# Patient Record
Sex: Male | Born: 2005 | Race: White | Hispanic: No | Marital: Single | Smoking: Never smoker
Health system: Southern US, Community
[De-identification: ages and names within clinical notes are randomized; demographics above are authoritative.]

---
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
## 2023-11-26 ENCOUNTER — Ambulatory Visit: Payer: Self-pay | Admitting: Nurse Practitioner

## 2023-11-26 ENCOUNTER — Encounter: Payer: Self-pay | Admitting: Nurse Practitioner

## 2023-11-26 VITALS — BP 118/74 | HR 97 | Temp 98.6°F

## 2023-11-26 DIAGNOSIS — G8929 Other chronic pain: Secondary | ICD-10-CM

## 2023-11-26 NOTE — Progress Notes (Signed)
  Subjective:     Patient ID: Peter Perry, male   DOB: Dec 24, 2005, 18 y.o.   MRN: [id number]  HPI: 18 yo male presents today with L knee pain. Onset with injury 1 year ago while playing basketball. He states that he fell on it weird. This occurred around 06/2022. On long drives, he has to pop his knee to make the pain go away. Popping his knee by pushing the knee and bending it makes the pain improve. Denies locking of knee. He has not tried any medications or other treatments. He is aware of the pain/discomfort with deep bending of the L knee. He has not seen anyone for this problem before. Describes pain and mild and intermittent. Denies increased pain today but his mother wanted him to have it checked out.    Review of Systems  Constitutional:  Negative for activity change.  Cardiovascular:  Negative for leg swelling.  Musculoskeletal:  Positive for arthralgias. Negative for gait problem, joint swelling and myalgias.       Objective:   Physical Exam Constitutional:      General: He is not in acute distress.    Appearance: Normal appearance.  HENT:     Head: Normocephalic and atraumatic.  Cardiovascular:     Rate and Rhythm: Normal rate and regular rhythm.     Heart sounds: No murmur heard.    No friction rub. No gallop.  Pulmonary:     Effort: Pulmonary effort is normal.     Breath sounds: Normal breath sounds.  Musculoskeletal:        General: No swelling or tenderness. Normal range of motion.     Right lower leg: No edema.     Left lower leg: No edema.     Comments: L knee and LLE without erythema, edema. 1+ BL pedal pulses. No TTP along knee joint. L knee stable AP and laterally. No pain with PROM. No pops, clicks palpated.   Skin:    General: Skin is warm and dry.     Findings: No bruising or erythema.  Neurological:     Mental Status: He is alert.        Assessment:     Chronic pain of left knee     Plan:     1. Chronic pain of left knee  Chronic L knee  pain post injury. Patient report of injury and no medical treatment up to this point. Discussed limiting activities that exacerbate pain and NSAID PRN with food. He does not feel it is a problem at this time. Discussed recommendations for Ortho with imaging as this is a longstanding issue. Suspect meniscal injury. He will consider. Return precautions reviewed. Verbalized agreement and understanding.

## 2023-12-01 ENCOUNTER — Ambulatory Visit
Admission: RE | Admit: 2023-12-01 | Discharge: 2023-12-01 | Disposition: A | Discharge status: Home / Self Care | Payer: Self-pay | Source: Ambulatory Visit | Attending: Emergency Medicine | Admitting: Emergency Medicine

## 2023-12-01 VITALS — BP 130/86 | HR 83 | Temp 98.1°F | Resp 16 | Wt 175.0 lb

## 2023-12-01 DIAGNOSIS — S90812A Abrasion, left foot, initial encounter: Secondary | ICD-10-CM | POA: Diagnosis not present

## 2023-12-01 MED ORDER — TETANUS-DIPHTH-ACELL PERTUSSIS 5-2.5-18.5 LF-MCG/0.5 IM SUSY
0.5000 mL | PREFILLED_SYRINGE | Freq: Once | INTRAMUSCULAR | Status: AC
  Administered 2023-12-01: 0.5 mL via INTRAMUSCULAR

## 2023-12-01 MED ORDER — CEPHALEXIN 500 MG PO CAPS: 500.0000 mg | ORAL_CAPSULE | Freq: Two times a day (BID) | ORAL | 0 refills | Status: AC

## 2023-12-01 NOTE — ED Provider Notes (Signed)
 [facility]    CSN: [id number] Arrival date & time: 12/01/23  0859      History   Chief Complaint Chief Complaint  Patient presents with   Foot Injury    I cut my foot over the weekend and need to have it looked at for work - Entered by patient    HPI Peter Perry is a 18 y.o. male.   Patient presents for evaluation of a laceration to the left foot beginning 3 days ago after stepping into a ditch, unknown offending agent.  Has full range of motion and is able to bear weight.  Only experience pain when pressure is applied directly to the wound.  Denies swelling, drainage, fever.  Last tetanus shot unknown but up-to-date on childhood vaccines.     History reviewed. No pertinent past medical history.  There are no active problems to display for this patient.   History reviewed. No pertinent surgical history.     Home Medications    Prior to Admission medications   Not on File    Family History Family History  Problem Relation Age of Onset   Healthy Mother    Healthy Father     Social History Social History   Tobacco Use   Smoking status: Never    Passive exposure: Never   Smokeless tobacco: Never  Vaping Use   Vaping status: Never Used  Substance Use Topics   Alcohol use: Never   Drug use: Never     Allergies   Patient has no known allergies.   Review of Systems Review of Systems   Physical Exam Triage Vital Signs ED Triage Vitals  Encounter Vitals Group     BP 12/01/23 0911 130/86     Girls Systolic BP Percentile --      Girls Diastolic BP Percentile --      Boys Systolic BP Percentile --      Boys Diastolic BP Percentile --      Pulse Rate 12/01/23 0911 83     Resp 12/01/23 0911 16     Temp 12/01/23 0911 98.1 F (36.7 C)     Temp Source 12/01/23 0911 Oral     SpO2 12/01/23 0911 98 %     Weight 12/01/23 0911 175 lb 0.7 oz (79.4 kg)     Height --      Head Circumference --      Peak Flow --      Pain Score 12/01/23  0910 8     Pain Loc --      Pain Education --      Exclude from Growth Chart --    No data found.  Updated Vital Signs BP 130/86 (BP Location: Left Arm)   Pulse 83   Temp 98.1 F (36.7 C) (Oral)   Resp 16   Wt 175 lb 0.7 oz (79.4 kg)   SpO2 98%   Visual Acuity Right Eye Distance:   Left Eye Distance:   Bilateral Distance:    Right Eye Near:   Left Eye Near:    Bilateral Near:     Physical Exam Constitutional:      Appearance: Normal appearance.  Eyes:     Extraocular Movements: Extraocular movements intact.  Pulmonary:     Effort: Pulmonary effort is normal.  Skin:    Comments: 2 x 3 abrasion present to the plantar aspect of the lateral midfoot, nondraining, tender to palpation, mild swelling, erythematous granulation tissue noted  Neurological:  Mental Status: He is alert and oriented to person, place, and time. Mental status is at baseline.      UC Treatments / Results  Labs (all labs ordered are listed, but only abnormal results are displayed) Labs Reviewed - No data to display  EKG   Radiology No results found.  Procedures Procedures (including critical care time)  Medications Ordered in UC Medications - No data to display  Initial Impression / Assessment and Plan / UC Course  I have reviewed the triage vital signs and the nursing notes.  Pertinent labs & imaging results that were available during my care of the patient were reviewed by me and considered in my medical decision making (see chart for details).  Abrasion of left foot, initial encounter  Empirically placed on antibiotics is still experiencing significant tenderness, cephalexin  prescribed, recommended daily cleansing, may leave open to air, recommend over-the-counter analgesics for pain, unknown cause therefore empirically given tetanus booster, advise follow-up for any delays in healing Final Clinical Impressions(s) / UC Diagnoses   Final diagnoses:  None   Discharge  Instructions   None    ED Prescriptions   None    PDMP not reviewed this encounter.   Teresa Shelba SAUN[place], [place] 12/01/23 [phone]

## 2023-12-01 NOTE — Discharge Instructions (Signed)
 Today you were evaluated for the wound to your foot which appears to be as great as the first layer of skin has been removed  As you are still experiencing tenderness we will empirically place you on antibiotics to ensure areas not become infected and that all germs have been removed  Take cephalexin  twice daily for 5 days  Cleanse over the wound with soap and water during normal hygiene, pat and do not rub, may apply a nonstick Band-Aid or leave open to air to preference  Tetanus shot has been updated as we do not know what has caused scrape, good for 10 years (2035)  May take Tylenol  and/or Motrin as needed for pain  May follow-up with urgent care as needed for any delays in healing

## 2023-12-01 NOTE — ED Triage Notes (Signed)
 Pt presents c/o foot injury of left foot. Pt says he fell in a ditch on Saturday and cut it.

## 2023-12-02 ENCOUNTER — Telehealth: Payer: Self-pay | Admitting: Registered Nurse

## 2023-12-02 ENCOUNTER — Encounter: Payer: Self-pay | Admitting: Registered Nurse

## 2023-12-02 DIAGNOSIS — S90812A Abrasion, left foot, initial encounter: Secondary | ICD-10-CM

## 2023-12-02 NOTE — Telephone Encounter (Signed)
 Patient seen by RN Asberry Budge [employer] 11/30/23 at 0835 injured foot at home over the weekend.  RN applied antiseptic first aid spray to affected area after removing bandaid then applied antibiotic ointment and new bandaid instructed patient to talk with supervisor regarding prolonged walking and standing to be decreased today until area can heal.  Spoke with mother patient no further needs 12/01/23 still on modified duty at work today but plans to go back to full duty tomorrow.  Noted patient had UC visit 12/01/23 am was given keflex  rx and tetanus booster for abrasion left foot.

## 2023-12-09 NOTE — Telephone Encounter (Signed)
 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

## 2023-12-17 ENCOUNTER — Other Ambulatory Visit (HOSPITAL_BASED_OUTPATIENT_CLINIC_OR_DEPARTMENT_OTHER): Payer: Self-pay

## 2023-12-17 MED ORDER — MELOXICAM 15 MG PO TABS
15.0000 mg | ORAL_TABLET | Freq: Every day | ORAL | 2 refills | Status: AC
  Filled 2023-12-17: qty 30, 30d supply, fill #0
